# Patient Record
Sex: Female | Born: 1980 | Race: White | Hispanic: No | Marital: Single | Smoking: Current every day smoker
Health system: Southern US, Community
[De-identification: ages and names within clinical notes are randomized; demographics above are authoritative.]

## PROBLEM LIST (undated history)

## (undated) DIAGNOSIS — N83209 Unspecified ovarian cyst, unspecified side: Secondary | ICD-10-CM

## (undated) DIAGNOSIS — K37 Unspecified appendicitis: Secondary | ICD-10-CM

## (undated) HISTORY — PX: TUBAL LIGATION: SHX77

## (undated) HISTORY — DX: Unspecified appendicitis: K37

## (undated) HISTORY — PX: LAPAROSCOPY: SHX197

## (undated) HISTORY — PX: APPENDECTOMY: SHX54

## (undated) HISTORY — DX: Unspecified ovarian cyst, unspecified side: N83.209

---

## 2010-09-02 ENCOUNTER — Encounter: Payer: Self-pay | Admitting: Pulmonary Disease

## 2010-09-08 ENCOUNTER — Emergency Department (HOSPITAL_BASED_OUTPATIENT_CLINIC_OR_DEPARTMENT_OTHER)
Admission: EM | Admit: 2010-09-08 | Discharge: 2010-09-08 | Payer: Self-pay | Source: Home / Self Care | Admitting: Emergency Medicine

## 2010-09-08 LAB — CBC
HCT: 41.6 % (ref 36.0–46.0)
Hemoglobin: 14 g/dL (ref 12.0–15.0)
MCV: 88.1 fL (ref 78.0–100.0)
Platelets: 160 10*3/uL (ref 150–400)
RBC: 4.72 MIL/uL (ref 3.87–5.11)
WBC: 8 10*3/uL (ref 4.0–10.5)

## 2010-09-08 LAB — WET PREP, GENITAL: Yeast Wet Prep HPF POC: NONE SEEN

## 2010-09-08 LAB — URINE MICROSCOPIC-ADD ON

## 2010-09-08 LAB — DIFFERENTIAL
Lymphocytes Relative: 17 % (ref 12–46)
Lymphs Abs: 1.3 10*3/uL (ref 0.7–4.0)
Monocytes Relative: 5 % (ref 3–12)
Neutro Abs: 6.2 10*3/uL (ref 1.7–7.7)
Neutrophils Relative %: 77 % (ref 43–77)

## 2010-09-08 LAB — PREGNANCY, URINE: Preg Test, Ur: NEGATIVE

## 2010-09-08 LAB — URINALYSIS, ROUTINE W REFLEX MICROSCOPIC
Bilirubin Urine: NEGATIVE
Nitrite: NEGATIVE
Protein, ur: 100 mg/dL — AB
Urobilinogen, UA: 1 mg/dL (ref 0.0–1.0)

## 2010-09-08 LAB — BASIC METABOLIC PANEL
BUN: 9 mg/dL (ref 6–23)
CO2: 26 mEq/L (ref 19–32)
Chloride: 110 mEq/L (ref 96–112)
Glucose, Bld: 68 mg/dL — ABNORMAL LOW (ref 70–99)
Potassium: 3.8 mEq/L (ref 3.5–5.1)

## 2010-09-09 LAB — GC/CHLAMYDIA PROBE AMP, GENITAL
Chlamydia, DNA Probe: NEGATIVE
GC Probe Amp, Genital: NEGATIVE

## 2010-09-19 ENCOUNTER — Emergency Department (HOSPITAL_BASED_OUTPATIENT_CLINIC_OR_DEPARTMENT_OTHER)
Admission: EM | Admit: 2010-09-19 | Discharge: 2010-09-19 | Disposition: A | Discharge status: Home / Self Care | Payer: Self-pay | Attending: Emergency Medicine | Admitting: Emergency Medicine

## 2010-09-19 ENCOUNTER — Emergency Department (INDEPENDENT_AMBULATORY_CARE_PROVIDER_SITE_OTHER): Payer: Self-pay

## 2010-09-19 DIAGNOSIS — R51 Headache: Secondary | ICD-10-CM | POA: Insufficient documentation

## 2010-09-19 DIAGNOSIS — N949 Unspecified condition associated with female genital organs and menstrual cycle: Secondary | ICD-10-CM

## 2010-09-19 DIAGNOSIS — F411 Generalized anxiety disorder: Secondary | ICD-10-CM | POA: Insufficient documentation

## 2010-09-19 DIAGNOSIS — R109 Unspecified abdominal pain: Secondary | ICD-10-CM | POA: Insufficient documentation

## 2010-09-19 LAB — URINALYSIS, ROUTINE W REFLEX MICROSCOPIC
Ketones, ur: NEGATIVE mg/dL
Nitrite: NEGATIVE
Protein, ur: NEGATIVE mg/dL

## 2010-09-25 ENCOUNTER — Emergency Department (HOSPITAL_BASED_OUTPATIENT_CLINIC_OR_DEPARTMENT_OTHER)
Admission: EM | Admit: 2010-09-25 | Discharge: 2010-09-25 | Disposition: A | Discharge status: Home / Self Care | Payer: Self-pay | Attending: Emergency Medicine | Admitting: Emergency Medicine

## 2010-09-25 DIAGNOSIS — E539 Vitamin B deficiency, unspecified: Secondary | ICD-10-CM | POA: Insufficient documentation

## 2010-09-25 DIAGNOSIS — R51 Headache: Secondary | ICD-10-CM | POA: Insufficient documentation

## 2010-09-25 DIAGNOSIS — F17200 Nicotine dependence, unspecified, uncomplicated: Secondary | ICD-10-CM | POA: Insufficient documentation

## 2010-09-25 DIAGNOSIS — R404 Transient alteration of awareness: Secondary | ICD-10-CM | POA: Insufficient documentation

## 2010-09-25 LAB — URINALYSIS, ROUTINE W REFLEX MICROSCOPIC
Hgb urine dipstick: NEGATIVE
Ketones, ur: NEGATIVE mg/dL
Protein, ur: NEGATIVE mg/dL
Urobilinogen, UA: 0.2 mg/dL (ref 0.0–1.0)

## 2010-09-25 LAB — CBC
HCT: 41.7 % (ref 36.0–46.0)
MCHC: 34.8 g/dL (ref 30.0–36.0)
RDW: 13.3 % (ref 11.5–15.5)

## 2010-09-25 LAB — BASIC METABOLIC PANEL
BUN: 10 mg/dL (ref 6–23)
CO2: 23 mEq/L (ref 19–32)
Calcium: 9.3 mg/dL (ref 8.4–10.5)
Creatinine, Ser: 0.9 mg/dL (ref 0.4–1.2)
GFR calc Af Amer: >60 mL/min (ref >60)
Glucose, Bld: 82 mg/dL (ref 70–99)

## 2010-09-25 LAB — DIFFERENTIAL
Basophils Absolute: 0 10*3/uL (ref 0.0–0.1)
Basophils Relative: 1 % (ref 0–1)
Eosinophils Relative: 1 % (ref 0–5)
Lymphocytes Relative: 30 % (ref 12–46)
Monocytes Absolute: 0.4 10*3/uL (ref 0.1–1.0)

## 2010-09-28 ENCOUNTER — Emergency Department (HOSPITAL_BASED_OUTPATIENT_CLINIC_OR_DEPARTMENT_OTHER)
Admission: EM | Admit: 2010-09-28 | Discharge: 2010-09-28 | Disposition: A | Discharge status: Home / Self Care | Payer: Self-pay | Attending: Emergency Medicine | Admitting: Emergency Medicine

## 2010-09-28 DIAGNOSIS — J984 Other disorders of lung: Secondary | ICD-10-CM | POA: Insufficient documentation

## 2010-09-28 DIAGNOSIS — R5381 Other malaise: Secondary | ICD-10-CM | POA: Insufficient documentation

## 2010-09-28 DIAGNOSIS — F411 Generalized anxiety disorder: Secondary | ICD-10-CM | POA: Insufficient documentation

## 2010-09-28 DIAGNOSIS — R52 Pain, unspecified: Secondary | ICD-10-CM | POA: Insufficient documentation

## 2010-09-28 DIAGNOSIS — F17200 Nicotine dependence, unspecified, uncomplicated: Secondary | ICD-10-CM | POA: Insufficient documentation

## 2010-09-28 LAB — SEDIMENTATION RATE: Sed Rate: 2 mm/hr (ref 0–22)

## 2010-10-03 ENCOUNTER — Encounter: Payer: Self-pay | Admitting: Pulmonary Disease

## 2010-10-03 ENCOUNTER — Institutional Professional Consult (permissible substitution) (INDEPENDENT_AMBULATORY_CARE_PROVIDER_SITE_OTHER): Payer: Self-pay | Admitting: Pulmonary Disease

## 2010-10-03 ENCOUNTER — Other Ambulatory Visit: Payer: Self-pay | Admitting: Pulmonary Disease

## 2010-10-03 ENCOUNTER — Telehealth: Payer: Self-pay | Admitting: Pulmonary Disease

## 2010-10-03 DIAGNOSIS — J984 Other disorders of lung: Secondary | ICD-10-CM

## 2010-10-03 DIAGNOSIS — R911 Solitary pulmonary nodule: Secondary | ICD-10-CM

## 2010-10-03 DIAGNOSIS — F411 Generalized anxiety disorder: Secondary | ICD-10-CM

## 2010-10-10 NOTE — Assessment & Plan Note (Signed)
Summary: pulm nodule/per jules/tammy d/mhh   Visit Type:  Initial Consult Copy to:  [facility] Primary Provider/Referring Provider:  n/a  CC:  Pulmonary consult. Pt c/o SOB intermittent x 2 weeks, burning chest pain radiating all over body x 1 month, and and nausea x 2 weeks.  History of Present Illness: 30/F , smoker referred for evaluation of pulmonary nodule. She was seen in [place] ED on 1/21 with left flank pain  > e. coli UTI . Ct abdomen picked up a 10 mm RLL nodule. Tubal ligation clip on the left  appeared dislodged. Seen by urologist , urethroscopy was nml She reports that Burning pain from vaginal area has moved to other body areas incl back & arms. c/o night sweats, palpitations & tingling in her extremities>> seen in [place], reviewed lab wirk, HIV neg, ANA neg, ESR 2 , CBC, BMET nnml >> attributed to anxiety She denies cough or dyspnea, has been under a lot of stress due to work & concern about her medical problems. has Gyn FU scheduled  Preventive Screening-Counseling & Management  Alcohol-Tobacco     Smoking Status: current     Packs/Day: 0.5     Year Started: 1995  Current Medications (verified): 1)  Tylenol Extra Strength 500 Mg Tabs (Acetaminophen) .... As Needed 2)  Trimethoprim 100 Mg Tabs (Trimethoprim) .... Take As Directed  Allergies (verified): 1)  ! Augmentin 2)  ! Rocephin 3)  ! 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  Smoking Status:  current Packs/Day:  0.5  Review of Systems       The patient complains of shortness of breath with activity, chest pain, loss of appetite, weight change, abdominal pain, ear ache, anxiety, depression, and fever.  The patient denies shortness of breath at rest, productive cough, non-productive cough, coughing up blood, irregular heartbeats, acid heartburn, indigestion, difficulty swallowing, sore throat, tooth/dental problems, headaches, nasal congestion/difficulty breathing through nose, sneezing, itching, hand/feet swelling, joint stiffness or pain, rash, and change in color of mucus.    Vital Signs:  Patient profile:   30 year old female Height:      62 inches Weight:      168 pounds BMI:     30.84 O2 Sat:      98 % on Room air Temp:     98.3 degrees F oral Pulse rate:   84 / minute BP sitting:   124 / 70  (right arm) Cuff size:   regular  Vitals Entered By: Zackery Barefoot CMA (October 03, 2010 10:23 AM)  O2 Flow:  Room air CC: Pulmonary consult. Pt c/o SOB intermittent x 2 weeks, burning chest pain radiating all over body x 1 month, and nausea x 2 weeks Comments Medications reviewed with patient Verified contact number and pharmacy with patient Zackery Barefoot CMA  October 03, 2010 10:24 AM    Physical Exam  Additional Exam:  pleasant, well built,  nourished HEENT -  no thrush, no post nasal drip no lymphadenopathy CVS- s1s2 nml, no murmur, no JVD RS- clear, no crackles or rhonchi Abd- soft, non-tender, no organomegaly CNS- non focal Ext - no clubbing, cyanosis or edema    Impression & Recommendations:  Problem # 1:  PULMONARY NODULE, RIGHT LOWER LOBE (ICD-518.89) Will review Ct abdomen images. need for serial imaging explained to document stability.  unless appearance is alarming, will plan on repeat CT chest in 3 mnths  Orders: Radiology Referral (Radiology) Internal Medicine Referral (Internal) Consultation Level III [phone])  Problem # 2:  GENERALIZED ANXIETY DISORDER (ICD-300.02)  Her systemic symptoms seem to be related to anxiety regarding her symptoms. It would be better for her to establish a PMD rather than use the ED for her symptoms. I have reasuured her & will try to get her a pMD in [place]. She has Gyn FU  Orders: Consultation Level III [phone])  Medications Added to Medication List This Visit: 1)  Tylenol Extra Strength 500 Mg Tabs (Acetaminophen) .... As needed 2)  Trimethoprim 100 Mg Tabs (Trimethoprim) .... Take as directed  Patient Instructions: 1)  Copy sent to: 2)  Please schedule a follow-up appointment in 3 months after your repeat CT  scan 3)  medical doctor in [place]

## 2010-10-19 NOTE — Progress Notes (Signed)
Summary: re: chest CT recently requested  Phone Note From Other Clinic   Caller: debbie w/ [facility] center Call For: alva Summary of Call: caller says they do not have a chest CT of pt. they do, however have a portable cxr that was done on 09/27/10. is this what you requested? caller says if not, you may want to call [facility] to request CT (if they have one). debbie's # is [phone] Initial call taken by: Tivis Ringer, CNA,  October 03, 2010 11:55 AM  Follow-up for Phone Call        Dr. Vassie Loll, would you like them to send the CXR report? Pt did not have a CT Chest per caller.  Pls advise thanks!  CT abdomen images - on a disc please Follow-up by: Comer Locket. Vassie Loll MD,  October 03, 2010 12:12 PM  Additional Follow-up for Phone Call Additional follow up Details #1::        Called and LM with coworker to have Debbie call back Vernie Murders  October 03, 2010 12:15 PM  ATC x 2 and line busy, WCB Vernie Murders  October 06, 2010 3:57 PM  Called, spoke with co-worker,  Will have debbie return call. Gweneth Dimitri RN  October 09, 2010 5:41 PM     Additional Follow-up for Phone Call Additional follow up Details #2::    Shanda Bumps could you pls work on this for Dr Vassie Loll.   Philipp Deputy [facility]  October 10, 2010 9:05 AM   Called and was advised Eunice Blase was out of the office today. Brandie took down pt's inforomation and advised she will get that in the mail and will call if she has any problems or questions. Zackery Barefoot CMA  October 10, 2010 10:29 AM

## 2011-01-02 ENCOUNTER — Ambulatory Visit: Payer: Self-pay

## 2014-05-20 ENCOUNTER — Encounter: Payer: Self-pay | Admitting: Obstetrics & Gynecology

## 2014-05-20 ENCOUNTER — Other Ambulatory Visit (HOSPITAL_COMMUNITY)
Admission: RE | Admit: 2014-05-20 | Discharge: 2014-05-20 | Disposition: A | Discharge status: Home / Self Care | Payer: Medicaid Other | Source: Ambulatory Visit | Attending: Obstetrics & Gynecology | Admitting: Obstetrics & Gynecology

## 2014-05-20 ENCOUNTER — Ambulatory Visit (INDEPENDENT_AMBULATORY_CARE_PROVIDER_SITE_OTHER): Payer: Medicaid Other | Admitting: Obstetrics & Gynecology

## 2014-05-20 ENCOUNTER — Other Ambulatory Visit: Payer: Self-pay | Admitting: Obstetrics & Gynecology

## 2014-05-20 VITALS — BP 131/82 | HR 97 | Resp 16 | Ht 63.0 in | Wt 170.0 lb

## 2014-05-20 DIAGNOSIS — Z113 Encounter for screening for infections with a predominantly sexual mode of transmission: Secondary | ICD-10-CM | POA: Diagnosis not present

## 2014-05-20 DIAGNOSIS — Z01411 Encounter for gynecological examination (general) (routine) with abnormal findings: Secondary | ICD-10-CM | POA: Insufficient documentation

## 2014-05-20 DIAGNOSIS — Z118 Encounter for screening for other infectious and parasitic diseases: Secondary | ICD-10-CM

## 2014-05-20 DIAGNOSIS — Z1151 Encounter for screening for human papillomavirus (HPV): Secondary | ICD-10-CM | POA: Insufficient documentation

## 2014-05-20 DIAGNOSIS — R87810 Cervical high risk human papillomavirus (HPV) DNA test positive: Secondary | ICD-10-CM | POA: Diagnosis present

## 2014-05-20 DIAGNOSIS — Z124 Encounter for screening for malignant neoplasm of cervix: Secondary | ICD-10-CM

## 2014-05-20 DIAGNOSIS — N920 Excessive and frequent menstruation with regular cycle: Secondary | ICD-10-CM

## 2014-05-20 DIAGNOSIS — Z01419 Encounter for gynecological examination (general) (routine) without abnormal findings: Secondary | ICD-10-CM

## 2014-05-20 NOTE — Progress Notes (Signed)
  Subjective:     Kimberly BreechJill Goodlow is a 33 y.o. female here for a routine exam.  Current complaints: heavy, painful menses.  Pt had nml menses all of her life on and off OCPs.  Pt began having 2 week long menses for several months.  Pt also states her boyfriend was unfaithfu land needs full STD work up.  Personal health questionnaire reviewed: yes.   Gynecologic History Patient's last menstrual period was 05/10/2014. Contraception: tubal ligation--pt states that one of her clips have migrated Last Pap: 2012. Results were: normal Last mammogram: n/a  Obstetric History OB History  Gravida Para Term Preterm AB SAB TAB Ectopic Multiple Living  2 2 2       2     # Outcome Date GA Lbr Len/2nd Weight Sex Delivery Anes PTL Lv  2 TRM      CS     1 TRM      CS          The following portions of the patient's history were reviewed and updated as appropriate: allergies, current medications, past family history, past medical history, past social history, past surgical history and problem list.  Review of Systems Pertinent items are noted in HPI.    Objective:   Filed Vitals:   05/20/14 1343  BP: 131/82  Pulse: 97  Resp: 16  Height: 5\' 3"  (1.6 m)  Weight: 170 lb (77.111 kg)   Vitals:  WNL General appearance: alert, cooperative and no distress Head: Normocephalic, without obvious abnormality, atraumatic Eyes: negative Throat: lips, mucosa, and tongue normal; teeth and gums normal Lungs: clear to auscultation bilaterally Breasts: normal appearance, no masses or tenderness, No nipple retraction or dimpling, No nipple discharge or bleeding Heart: regular rate and rhythm Abdomen: soft, non-tender; bowel sounds normal; no masses,  no organomegaly  Pelvic:  External Genitalia:  Tanner V, no lesion Urethra:  No prolapse Vagina:  Pink, normal rugae, no blood; copious white discharge Cervix:  No CMT, no lesion Uterus:  Normal size and contour, non tender Adnexa:  Normal, no masses, non  tender  Extremities: no edema, redness or tenderness in the calves or thighs Skin: no lesions or rash Lymph nodes: Axillary adenopathy: none      Assessment:    Healthy female exam.  Menorrhagia   Plan:    Contraception: tubal ligation.   Pap smear with HPV Start seasonale TSH, cbc TVUS RT 2-3 weeks Will biopsy if lining thick.  Pt stopped menses 4 days ago so should be relatively thin if done today or tomorrow. BD affirm for vaginal discharge Full STD panel.

## 2014-05-21 ENCOUNTER — Telehealth: Payer: Self-pay | Admitting: *Deleted

## 2014-05-21 DIAGNOSIS — N760 Acute vaginitis: Principal | ICD-10-CM

## 2014-05-21 DIAGNOSIS — B9689 Other specified bacterial agents as the cause of diseases classified elsewhere: Secondary | ICD-10-CM

## 2014-05-21 LAB — HEPATITIS B SURFACE ANTIGEN: Hepatitis B Surface Ag: NEGATIVE

## 2014-05-21 LAB — T4, FREE: Free T4: 1.34 ng/dL (ref 0.80–1.80)

## 2014-05-21 LAB — TSH: TSH: 0.311 u[IU]/mL — ABNORMAL LOW (ref 0.350–4.500)

## 2014-05-21 LAB — CBC
HEMATOCRIT: 37.3 % (ref 36.0–46.0)
HEMOGLOBIN: 12.5 g/dL (ref 12.0–15.0)
MCH: 28.2 pg (ref 26.0–34.0)
MCHC: 33.5 g/dL (ref 30.0–36.0)
MCV: 84 fL (ref 78.0–100.0)
Platelets: 259 10*3/uL (ref 150–400)
RBC: 4.44 MIL/uL (ref 3.87–5.11)
RDW: 15.1 % (ref 11.5–15.5)
WBC: 9 10*3/uL (ref 4.0–10.5)

## 2014-05-21 LAB — T3, FREE: T3, Free: 3.4 pg/mL (ref 2.3–4.2)

## 2014-05-21 LAB — HEPATITIS C ANTIBODY: HCV AB: NEGATIVE

## 2014-05-21 LAB — WET PREP BY MOLECULAR PROBE
Candida species: NEGATIVE
GARDNERELLA VAGINALIS: POSITIVE — AB
Trichomonas vaginosis: NEGATIVE

## 2014-05-21 LAB — RPR

## 2014-05-21 LAB — HIV ANTIBODY (ROUTINE TESTING W REFLEX): HIV: NONREACTIVE

## 2014-05-21 MED ORDER — METRONIDAZOLE 500 MG PO TABS: 500.0000 mg | ORAL_TABLET | Freq: Two times a day (BID) | ORAL | Status: AC

## 2014-05-21 NOTE — Telephone Encounter (Signed)
Pt notified of positive Gardnerella on [facility]irm.  [facility] Dr Penne LashLeggett.

## 2014-05-23 LAB — OPIATES/OPIOIDS (LC/MS-MS)
Codeine Urine: NEGATIVE ng/mL (ref <50)
HYDROMORPHONE: NEGATIVE ng/mL (ref <50)
Hydrocodone: NEGATIVE ng/mL (ref <50)
MORPHINE: 450 ng/mL — AB (ref <50)
NORHYDROCODONE, UR: NEGATIVE ng/mL (ref <50)
NOROXYCODONE, UR: 12342 ng/mL — AB (ref <50)
OXYCODONE, UR: 5746 ng/mL — AB (ref <50)
OXYMORPHONE, URINE: 1575 ng/mL — AB (ref <50)

## 2014-05-23 LAB — CANNABANOIDS (GC/LC/MS), URINE: THC-COOH UR CONFIRM: 260 ng/mL — AB (ref <5)

## 2014-05-23 LAB — OXYCODONE, URINE (LC/MS-MS)
Noroxycodone, Ur: 12342 ng/mL — AB (ref <50)
OXYMORPHONE, URINE: 1575 ng/mL — AB (ref <50)
Oxycodone, ur: 5746 ng/mL — AB (ref <50)

## 2014-05-24 ENCOUNTER — Other Ambulatory Visit: Payer: Self-pay

## 2014-05-24 ENCOUNTER — Ambulatory Visit: Payer: Self-pay | Admitting: Obstetrics & Gynecology

## 2014-05-25 ENCOUNTER — Ambulatory Visit (INDEPENDENT_AMBULATORY_CARE_PROVIDER_SITE_OTHER): Payer: Medicaid Other

## 2014-05-25 DIAGNOSIS — N920 Excessive and frequent menstruation with regular cycle: Secondary | ICD-10-CM

## 2014-05-25 DIAGNOSIS — N924 Excessive bleeding in the premenopausal period: Secondary | ICD-10-CM

## 2014-05-25 LAB — PRESCRIPTION MONITORING PROFILE (19 PANEL)
AMPHETAMINE/METH: NEGATIVE ng/mL
BUPRENORPHINE, URINE: NEGATIVE ng/mL
Barbiturate Screen, Urine: NEGATIVE ng/mL
Benzodiazepine Screen, Urine: NEGATIVE ng/mL
CARISOPRODOL, URINE: NEGATIVE ng/mL
COCAINE METABOLITES: NEGATIVE ng/mL
CREATININE, URINE: 255.63 mg/dL (ref >20.0)
ECSTASY: NEGATIVE ng/mL
FENTANYL URINE: NEGATIVE ng/mL
MEPERIDINE UR: NEGATIVE ng/mL
METHADONE SCREEN, URINE: NEGATIVE ng/mL
METHAQUALONE SCREEN (URINE): NEGATIVE ng/mL
Nitrites, Initial: NEGATIVE ug/mL
PHENCYCLIDINE, UR: NEGATIVE ng/mL
Propoxyphene: NEGATIVE ng/mL
TAPENTADOLUR: NEGATIVE ng/mL
Tramadol Scrn, Ur: NEGATIVE ng/mL
Zolpidem, Urine: NEGATIVE ng/mL
pH, Initial: 5.7 pH (ref 4.5–8.9)

## 2014-05-25 LAB — CERVICOVAGINAL ANCILLARY ONLY
Chlamydia: NEGATIVE
Neisseria Gonorrhea: NEGATIVE

## 2014-05-25 LAB — CYTOLOGY - PAP

## 2014-05-27 ENCOUNTER — Encounter: Payer: Self-pay | Admitting: Obstetrics & Gynecology

## 2014-05-27 DIAGNOSIS — N939 Abnormal uterine and vaginal bleeding, unspecified: Secondary | ICD-10-CM | POA: Insufficient documentation

## 2014-05-27 DIAGNOSIS — IMO0002 Reserved for concepts with insufficient information to code with codable children: Secondary | ICD-10-CM | POA: Insufficient documentation

## 2014-05-27 DIAGNOSIS — R87612 Low grade squamous intraepithelial lesion on cytologic smear of cervix (LGSIL): Secondary | ICD-10-CM | POA: Insufficient documentation

## 2014-05-31 ENCOUNTER — Telehealth: Payer: Self-pay | Admitting: *Deleted

## 2014-05-31 NOTE — Telephone Encounter (Signed)
Called pt to adv needs Colpo and Endo Bx at next visit which is scheduled 06/08/14. LMOM for pt to rtn call.

## 2014-05-31 NOTE — Telephone Encounter (Signed)
Message copied by Arne ClevelandHUTCHINSON, MANDY J on Mon May 31, 2014  8:30 AM ------      Message from: Lesly DukesLEGGETT, KELLY H      Created: Thu May 27, 2014 10:09 AM       Pt needs colpo. ------

## 2014-05-31 NOTE — Telephone Encounter (Signed)
Message copied by Arne ClevelandHUTCHINSON, Caeden Foots J on Mon May 31, 2014 11:05 AM ------      Message from: Lesly DukesLEGGETT, KELLY H      Created: Thu May 27, 2014 10:09 AM       Pt needs colpo. ------

## 2014-05-31 NOTE — Telephone Encounter (Signed)
Pt called in to get results. Adv needs Colpo and Endo Bx. Pt expressed understanding. Appt made for 06/08/14

## 2014-06-08 ENCOUNTER — Ambulatory Visit: Payer: Self-pay | Admitting: Obstetrics & Gynecology

## 2014-06-09 ENCOUNTER — Encounter: Payer: Self-pay | Admitting: *Deleted

## 2014-06-14 ENCOUNTER — Encounter: Payer: Self-pay | Admitting: Obstetrics & Gynecology

## 2014-06-22 ENCOUNTER — Encounter: Payer: Self-pay | Admitting: Obstetrics & Gynecology

## 2014-06-22 ENCOUNTER — Ambulatory Visit (INDEPENDENT_AMBULATORY_CARE_PROVIDER_SITE_OTHER): Payer: Medicaid Other | Admitting: Obstetrics & Gynecology

## 2014-06-22 VITALS — BP 115/65 | HR 67 | Resp 16 | Wt 169.0 lb

## 2014-06-22 DIAGNOSIS — R87612 Low grade squamous intraepithelial lesion on cytologic smear of cervix (LGSIL): Secondary | ICD-10-CM

## 2014-06-22 DIAGNOSIS — N920 Excessive and frequent menstruation with regular cycle: Secondary | ICD-10-CM

## 2014-06-22 DIAGNOSIS — Z01812 Encounter for preprocedural laboratory examination: Secondary | ICD-10-CM

## 2014-06-22 DIAGNOSIS — R87810 Cervical high risk human papillomavirus (HPV) DNA test positive: Secondary | ICD-10-CM

## 2014-06-22 DIAGNOSIS — IMO0002 Reserved for concepts with insufficient information to code with codable children: Secondary | ICD-10-CM

## 2014-06-22 LAB — POCT URINE PREGNANCY: Preg Test, Ur: NEGATIVE

## 2014-06-22 NOTE — Addendum Note (Signed)
Addended by: Granville LewisLARK, Normand Damron L on: 06/22/2014 01:05 PM   Modules accepted: Orders

## 2014-06-22 NOTE — Progress Notes (Signed)
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   ENDOMETRIAL BIOPSY     The indications for endometrial biopsy were reviewed.   Risks of the biopsy including cramping, bleeding, infection, uterine perforation, inadequate specimen and need for additional procedures  were discussed. The patient states she understands and agrees to undergo procedure today. Consent was signed. Time out was performed. Urine HCG was negative. A sterile speculum was placed in the patient's vagina and the cervix was prepped with Betadine. A single-toothed tenaculum was placed on the anterior lip of the cervix to stabilize it. The 3 mm pipelle was introduced into the endometrial cavity without difficulty to a depth of 9 cm, and a moderate amount of tissue was obtained and sent to pathology. The instruments were removed from the patient's vagina. Minimal bleeding from the cervix was noted. The patient tolerated the procedure well. Routine post-procedure instructions were given to the patient. The patient will follow up to review the results and for further management.     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 There ae no other pap smears in our system and patient denies prior abnml paps/colpo/LEEPS/Cryo Procedure Details  The risks and benefits of the procedure and Written informed consent obtained.  Speculum placed in vagina and excellent visualization of cervix achieved, cervix swabbed x 3 with acetic acid solution.  Findings: Cervix: TZ not visualized.  Acetic acid and Lugol's placed.   Vaginal inspection: vaginal colposcopy not performed. Vulvar colposcopy: vulvar colposcopy not performed.  Specimens: ECC  Complications: none.  Plan: Specimens labelled and sent to Pathology. Will base further treatment on Pathology findings. Post biopsy  instructions given to patient.

## 2014-06-28 ENCOUNTER — Telehealth: Payer: Self-pay | Admitting: *Deleted

## 2014-06-28 NOTE — Telephone Encounter (Signed)
Pt called in for Biopsy results. Advised path report shows same as pap - LGSIL and that Dr. Penne LashLeggett has not reviewed as of yet. I told the patient that once all labs have been reviewed we will call her and let her know what the treatment plan is. Pt expressed understanding but still concerned about the pain she has with no explaination. I told her I will send Dr. Penne LashLeggett a message to see what she wants the plan to be. Pt expressed understanding.

## 2014-07-03 ENCOUNTER — Other Ambulatory Visit: Payer: Self-pay | Admitting: Obstetrics & Gynecology

## 2014-07-03 DIAGNOSIS — R102 Pelvic and perineal pain: Secondary | ICD-10-CM

## 2014-07-05 ENCOUNTER — Telehealth: Payer: Self-pay | Admitting: *Deleted

## 2014-07-05 DIAGNOSIS — R102 Pelvic and perineal pain: Secondary | ICD-10-CM

## 2014-07-05 NOTE — Telephone Encounter (Signed)
-----   Message from Lesly DukesKelly H Leggett, MD sent at 07/03/2014  6:01 AM EST ----- Endometrial biopsy is negative.  Pt still having abdominal pain.  Recommend CT abd pelvis with IV and po contrast to r/o non gyn etiology.  (US was nml except for thickened endometrium)

## 2014-09-13 DEATH — deceased

## 2016-08-18 IMAGING — US US PELVIS COMPLETE
1 series · 13 of 25 positions shown · non-contrast
Comparison: None

CLINICAL DATA: Menorrhagia.  Premenopausal.  LMP 05/02/2014



[Series 1: us pelvis complete · 0.18mm/px · 13 of 70 slices shown]
[im 1/70]
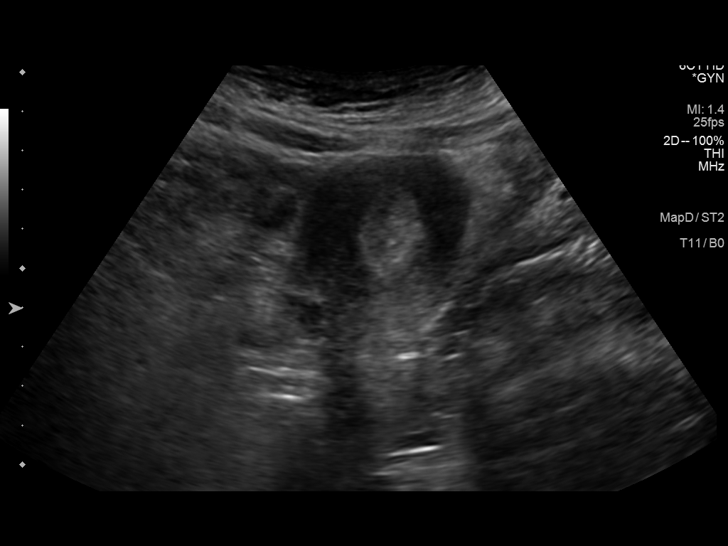
[im 6/70]
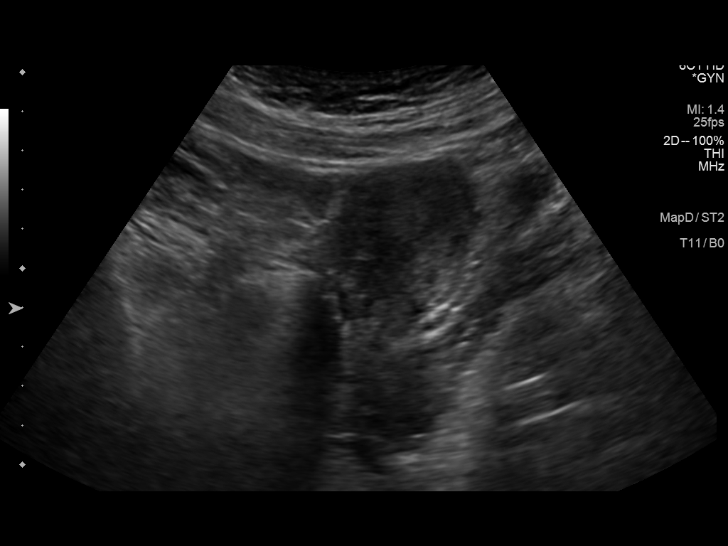
[im 12/70]
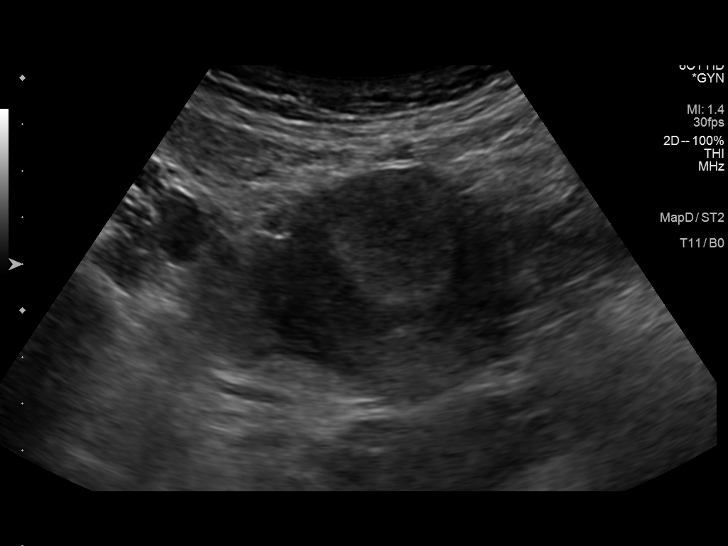
[im 18/70]
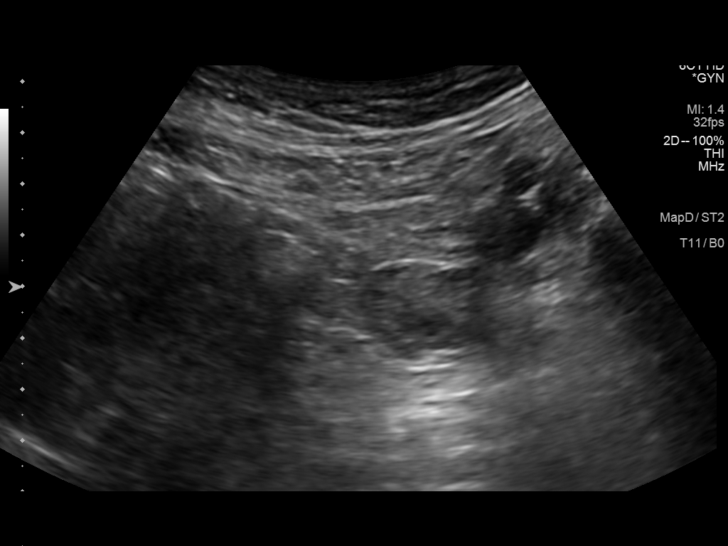
[im 24/70]
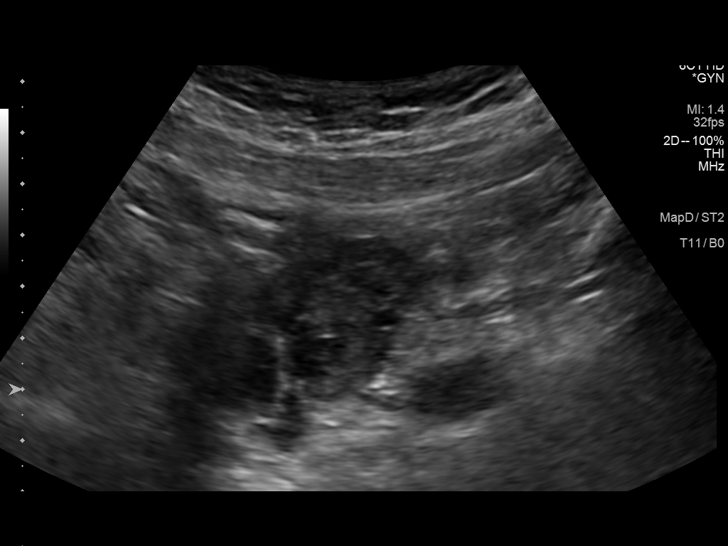
[im 29/70]
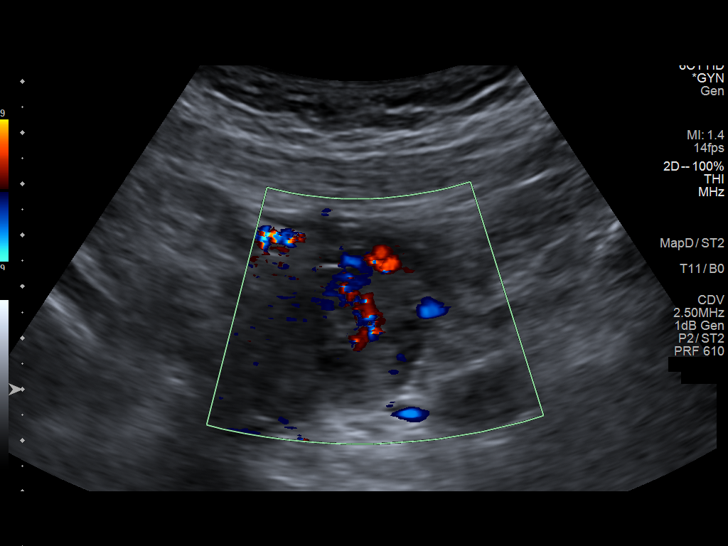
[im 35/70]
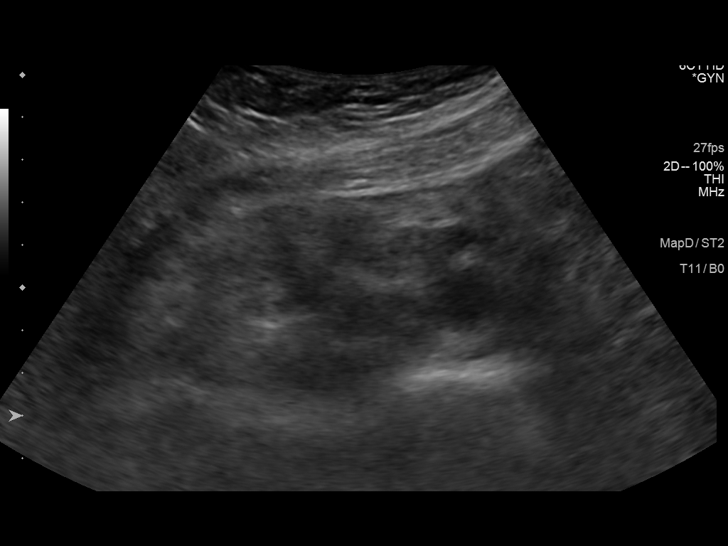
[im 41/70]
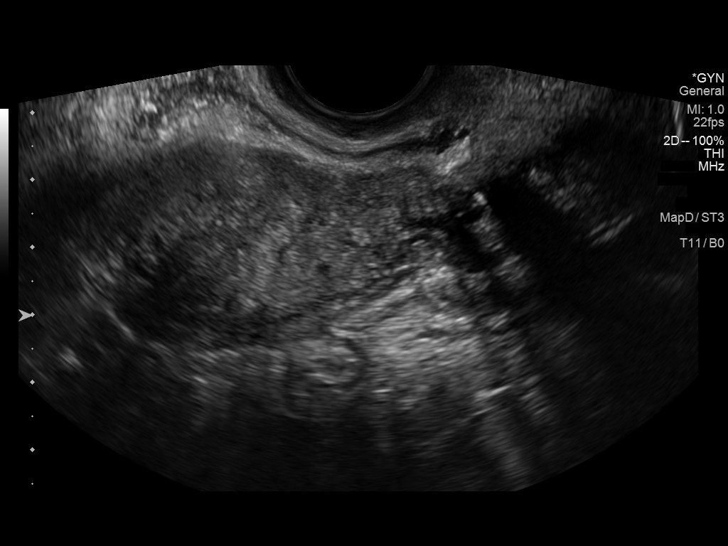
[im 47/70]
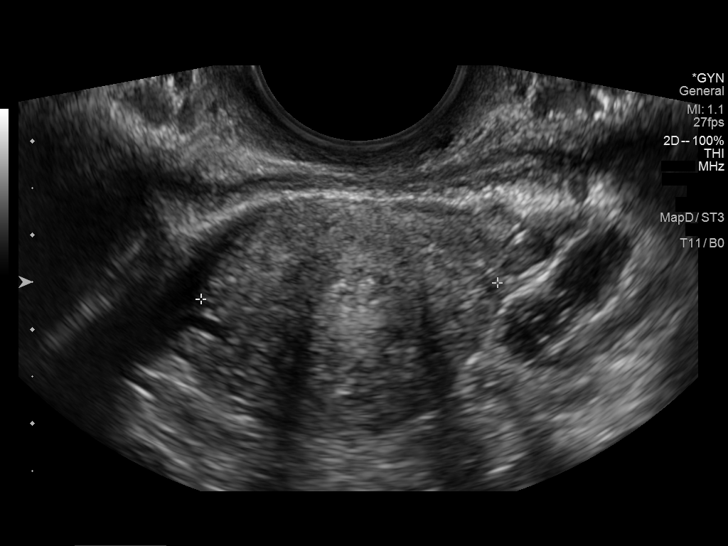
[im 52/70]
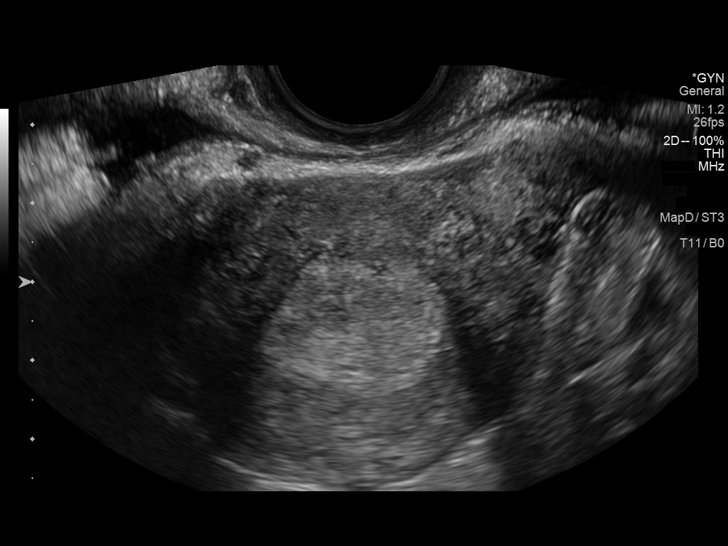
[im 58/70]
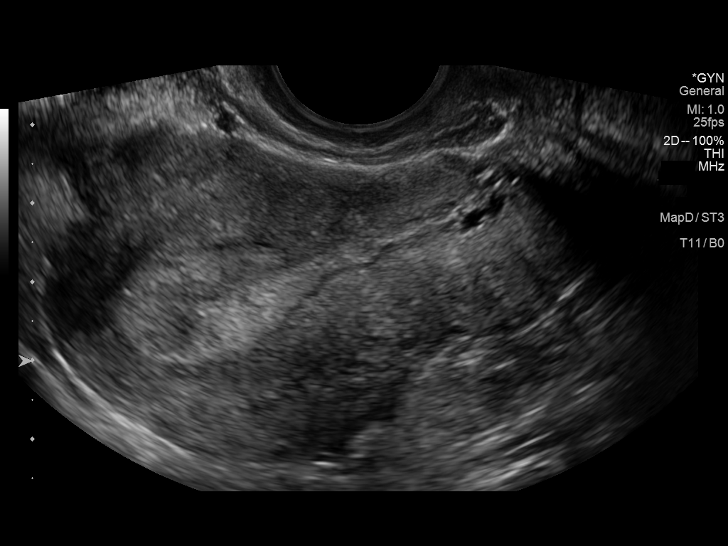
[im 64/70]
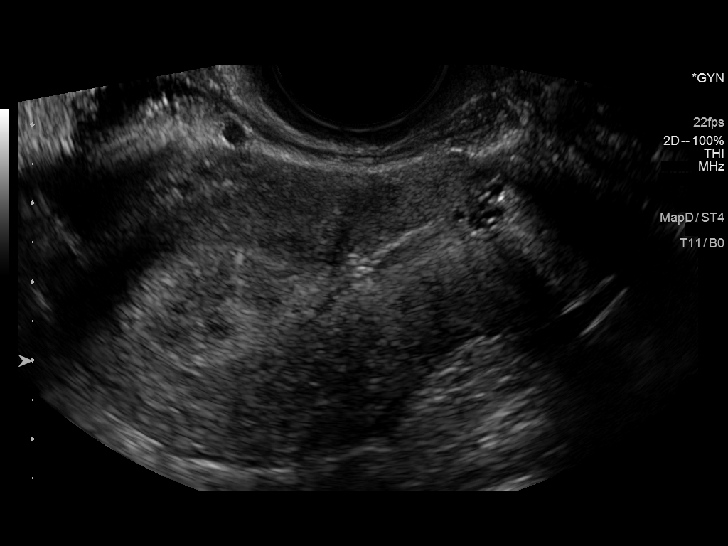
[im 70/70]
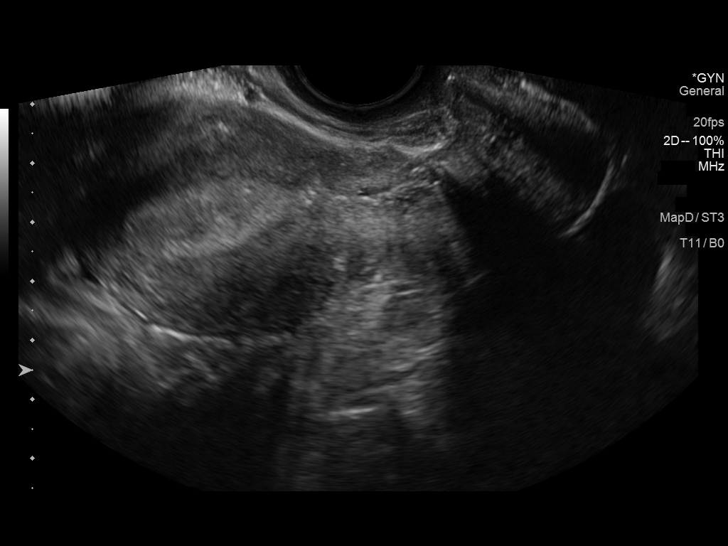

[13 of 25 positions shown; findings below may reference images not displayed]

FINDINGS: Uterus

Measurements: 7.7 x 4.1 x 3.2 cm.. Uterus is mildly heterogeneous
without discrete focal mass.

Endometrium

Thickness: 16 mm.  Mildly heterogeneous without focal mass.

Right ovary

Measurements: 3.2 x 2.4 x 2.3 cm. Normal appearance/no adnexal mass.

Left ovary

Measurements: 3.1 x 1.7 x 2.5 cm. Normal appearance/no adnexal mass.

Other findings

No free fluid.
IMPRESSION: 1. Normal appearance of the uterus.
2. Mildly heterogeneous, thickened endometrium. If bleeding remains
unresponsive to hormonal or medical therapy, focal lesion work-up
with sonohysterogram should be considered. Endometrial biopsy should
also be considered in pre-menopausal patients at high risk for
endometrial carcinoma. (Ref: Radiological Reasoning: Algorithmic
Workup of Abnormal Vaginal Bleeding with Endovaginal Sonography and
Sonohysterography. AJR [id number]; 191:S68-73).
3. Normal appearance of the ovaries.
